# Patient Record
Sex: Female | Born: 1995 | Race: Asian | Hispanic: No | Marital: Single | Smoking: Never smoker
Health system: Southern US, Community
[De-identification: ages and names within clinical notes are randomized; demographics above are authoritative.]

## PROBLEM LIST (undated history)

## (undated) DIAGNOSIS — L930 Discoid lupus erythematosus: Secondary | ICD-10-CM

## (undated) HISTORY — PX: WRIST SURGERY: SHX841

---

## 2021-12-01 ENCOUNTER — Emergency Department (HOSPITAL_BASED_OUTPATIENT_CLINIC_OR_DEPARTMENT_OTHER)
Admission: EM | Admit: 2021-12-01 | Discharge: 2021-12-01 | Disposition: A | Discharge status: Home / Self Care | Payer: 59 | Attending: Emergency Medicine | Admitting: Emergency Medicine

## 2021-12-01 ENCOUNTER — Encounter (HOSPITAL_BASED_OUTPATIENT_CLINIC_OR_DEPARTMENT_OTHER): Payer: Self-pay | Admitting: Emergency Medicine

## 2021-12-01 ENCOUNTER — Emergency Department (HOSPITAL_BASED_OUTPATIENT_CLINIC_OR_DEPARTMENT_OTHER): Payer: 59

## 2021-12-01 ENCOUNTER — Other Ambulatory Visit: Payer: Self-pay

## 2021-12-01 DIAGNOSIS — W19XXXA Unspecified fall, initial encounter: Secondary | ICD-10-CM | POA: Diagnosis not present

## 2021-12-01 DIAGNOSIS — S59911A Unspecified injury of right forearm, initial encounter: Secondary | ICD-10-CM | POA: Diagnosis present

## 2021-12-01 DIAGNOSIS — S52501A Unspecified fracture of the lower end of right radius, initial encounter for closed fracture: Secondary | ICD-10-CM | POA: Insufficient documentation

## 2021-12-01 DIAGNOSIS — M25531 Pain in right wrist: Secondary | ICD-10-CM | POA: Insufficient documentation

## 2021-12-01 MED ORDER — KETOROLAC TROMETHAMINE 60 MG/2ML IM SOLN
30.0000 mg | Freq: Once | INTRAMUSCULAR | Status: AC
  Administered 2021-12-01: 30 mg via INTRAMUSCULAR
  Filled 2021-12-01: qty 2

## 2021-12-01 MED ORDER — IBUPROFEN 800 MG PO TABS: 800.0000 mg | ORAL_TABLET | Freq: Three times a day (TID) | ORAL | 0 refills | Status: DC

## 2021-12-01 MED ORDER — TRAMADOL HCL 50 MG PO TABS: 50.0000 mg | ORAL_TABLET | Freq: Four times a day (QID) | ORAL | 0 refills | Status: DC | PRN

## 2021-12-01 MED ORDER — TRAMADOL HCL 50 MG PO TABS
50.0000 mg | ORAL_TABLET | Freq: Once | ORAL | Status: AC
  Administered 2021-12-01: 50 mg via ORAL
  Filled 2021-12-01: qty 1

## 2021-12-01 NOTE — ED Provider Notes (Signed)
[facility] EMERGENCY DEPT Provider Note   CSN: [id number] Arrival date & time: 12/01/21  0023     History  Chief Complaint  Patient presents with   Cathy Olsen is a 26 y.o. female.  The history is provided by the patient.  Fall This is a new problem. The current episode started 6 to 12 hours ago. The problem occurs rarely. The problem has been resolved. Pertinent negatives include no chest pain, no abdominal pain, no headaches and no shortness of breath. Nothing aggravates the symptoms. Nothing relieves the symptoms. She has tried nothing for the symptoms. The treatment provided no relief.  Slipped getting out of the shower.  Fall on an outstretched hand, did not hit head.      Home Medications Prior to Admission medications   Not on File      Allergies    Patient has no known allergies.    Review of Systems   Review of Systems  Constitutional:  Negative for fever.  HENT:  Negative for facial swelling.   Eyes:  Negative for redness.  Respiratory:  Negative for shortness of breath.   Cardiovascular:  Negative for chest pain.  Gastrointestinal:  Negative for abdominal pain.  Musculoskeletal:  Positive for arthralgias and joint swelling. Negative for neck pain.  Skin:  Negative for rash.  Neurological:  Negative for headaches.  Psychiatric/Behavioral:  Negative for agitation.   All other systems reviewed and are negative.  Physical Exam Updated Vital Signs BP 96/68 (BP Location: Left Arm)    Pulse 75    Temp 97.7 F (36.5 C) (Oral)    Resp 18    Wt 56.7 kg    LMP 11/28/2021 (Approximate)    SpO2 98%  Physical Exam Vitals and nursing note reviewed.  Constitutional:      General: She is not in acute distress.    Appearance: Normal appearance.  HENT:     Head: Normocephalic and atraumatic.     Nose: Nose normal.  Eyes:     Extraocular Movements: Extraocular movements intact.     Conjunctiva/sclera: Conjunctivae normal.      Pupils: Pupils are equal, round, and reactive to light.  Cardiovascular:     Rate and Rhythm: Normal rate and regular rhythm.     Pulses: Normal pulses.     Heart sounds: Normal heart sounds.  Pulmonary:     Effort: Pulmonary effort is normal.     Breath sounds: Normal breath sounds.  Abdominal:     General: Abdomen is flat. Bowel sounds are normal.     Palpations: Abdomen is soft.     Tenderness: There is no abdominal tenderness. There is no guarding.     Hernia: No hernia is present.  Musculoskeletal:     Right wrist: Swelling, tenderness and bony tenderness present. No effusion, lacerations, snuff box tenderness or crepitus. Decreased range of motion. Normal pulse.     Right hand: Normal. No tenderness or bony tenderness. Normal range of motion. Normal strength. Normal sensation. There is no disruption of two-point discrimination. Normal capillary refill. Normal pulse.     Left hand: Normal.     Cervical back: Normal range of motion and neck supple.     Comments: Right hand NVI  Skin:    General: Skin is warm and dry.     Capillary Refill: Capillary refill takes less than 2 seconds.  Neurological:     General: No focal deficit present.  Mental Status: She is alert and oriented to person, place, and time.     Deep Tendon Reflexes: Reflexes normal.  Psychiatric:        Mood and Affect: Mood normal.        Behavior: Behavior normal.    ED Results / Procedures / Treatments   Labs (all labs ordered are listed, but only abnormal results are displayed) Labs Reviewed - No data to display  EKG None  Radiology DG Wrist Complete Right  Result Date: 12/01/2021 CLINICAL DATA:  Fall EXAM: RIGHT WRIST - COMPLETE 3+ VIEW COMPARISON:  None. FINDINGS: Comminuted transverse distal radial fracture. No definite intra-articular extension. Mild soft tissue swelling. IMPRESSION: Comminuted distal radial fracture. Electronically Signed   By: Julian Hy M.D.   On: 12/01/2021 01:26     Procedures Procedures    Medications Ordered in ED Medications  ketorolac (TORADOL) injection 30 mg (30 mg Intramuscular Given 12/01/21 0323)  traMADol (ULTRAM) tablet 50 mg (50 mg Oral Given 12/01/21 0324)    ED Course/ Medical Decision Making/ A&P                           Medical Decision Making Fall on outstretched hand, no associated symptoms  Amount and/or Complexity of Data Reviewed Radiology: ordered.  Risk Prescription drug management. Risk Details: FOOS mechanism.  Distal radius fracture.  Thumb spica splint applied.  NVI right hand.  Cap refill < 2 seconds to all digits of the hand pre and post splint.  Ice elevation pain medication and call hand surgery for close follow up.  Patient and father verbalize understanding.  Patient may not drive or drink alcohol while taking pain medication and keep splint dry at all times.      Final Clinical Impression(s) / ED Diagnoses Return for intractable cough, coughing up blood, fevers > 100.4 unrelieved by medication, shortness of breath, intractable vomiting, chest pain, shortness of breath, weakness, numbness, changes in speech, facial asymmetry, abdominal pain, passing out, Inability to tolerate liquids or food, cough, altered mental status or any concerns. No signs of systemic illness or infection. The patient is nontoxic-appearing on exam and vital signs are within normal limits.  I have reviewed the triage vital signs and the nursing notes. Pertinent labs & imaging results that were available during my care of the patient were reviewed by me and considered in my medical decision making (see chart for details). After history, exam, and medical workup I feel the patient has been appropriately medically screened and is safe for discharge home. Pertinent diagnoses were discussed with the patient. Patient was given return precautions.             Rubyann Lingle, MD 12/01/21 [phone]

## 2022-07-26 ENCOUNTER — Ambulatory Visit
Admission: RE | Admit: 2022-07-26 | Discharge: 2022-07-26 | Disposition: A | Discharge status: Home / Self Care | Payer: 59 | Source: Ambulatory Visit | Attending: Emergency Medicine | Admitting: Emergency Medicine

## 2022-07-26 VITALS — BP 103/73 | HR 106 | Temp 98.2°F | Resp 16

## 2022-07-26 DIAGNOSIS — J020 Streptococcal pharyngitis: Secondary | ICD-10-CM | POA: Diagnosis not present

## 2022-07-26 LAB — POCT RAPID STREP A (OFFICE): Rapid Strep A Screen: POSITIVE — AB

## 2022-07-26 MED ORDER — CEFDINIR 300 MG PO CAPS: 300.0000 mg | ORAL_CAPSULE | Freq: Two times a day (BID) | ORAL | 0 refills | Status: AC

## 2022-07-26 NOTE — Discharge Instructions (Signed)
Your strep test today is positive.  I recommend that you begin antibiotics now for treatment.  I have sent a prescription for cefdinir to your pharmacy.  Please take them as prescribed.    After 24 hours of antibiotics, please discard your toothbrush as well as any other oral devices that you are currently using and replace them with new ones to avoid reinfection.  You should begin to feel better in 24 to 48 hours.   Once you have been on antibiotics for a full 24 hours, you are no longer considered contagious.   I have provided you with a note to return to work.   Even if you are feeling better, please make sure that you finish the full 10-day course and do not skip any doses.  Failure to complete a full course of antibiotics for strep throat can result in worsening infection that may require longer treatment with stronger antibiotics.   Thank you for visiting urgent care today.

## 2022-07-26 NOTE — ED Triage Notes (Signed)
The patient states yesterday she began having a sore throat, nasal drainage and low grade fever. Home interventions: mucinex and advil

## 2022-07-26 NOTE — ED Provider Notes (Signed)
[facility]    CSN: [id number] Arrival date & time: 07/26/22  1534    HISTORY   Chief Complaint  Patient presents with   Sore Throat    Experiencing Sore Throat, Nasal Discharge (Dark Brown/Yellow), Low Grade Fever, Pain under R Breast - Entered by patient   Nasal Congestion   HPI Cathy Olsen is a pleasant, 26 y.o. female who presents to urgent care today. Patient complains of sore throat, dark brown/yellow nasal discharge, low-grade fever pain under her right breast that began yesterday.  Patient states she has been taking Mucinex and Advil without meaningful relief of her symptoms.  Patient denies headache, nausea, body aches, chills, known sick contacts.  The history is provided by the patient.   History reviewed. No pertinent past medical history. There are no problems to display for this patient.  History reviewed. No pertinent surgical history. OB History   No obstetric history on file.    Home Medications    Prior to Admission medications   Medication Sig Start Date End Date Taking? Authorizing Provider  ibuprofen (ADVIL) 800 MG tablet Take 1 tablet (800 mg total) by mouth 3 (three) times daily. 12/01/21   Palumbo, April, MD  traMADol (ULTRAM) 50 MG tablet Take 1 tablet (50 mg total) by mouth every 6 (six) hours as needed for severe pain. 12/01/21   Palumbo, April, MD    Family History History reviewed. No pertinent family history. Social History Social History   Tobacco Use   Smoking status: Never   Smokeless tobacco: Never  Vaping Use   Vaping Use: Never used  Substance Use Topics   Alcohol use: Never   Drug use: Never   Allergies   Patient has no known allergies.  Review of Systems Review of Systems Pertinent findings revealed after performing a 14 point review of systems has been noted in the history of present illness.  Physical Exam Triage Vital Signs ED Triage Vitals  Enc Vitals Group     BP 08/31/21 0827 (!) 147/82      Pulse Rate 08/31/21 0827 72     Resp 08/31/21 0827 18     Temp 08/31/21 0827 98.3 F (36.8 C)     Temp Source 08/31/21 0827 Oral     SpO2 08/31/21 0827 98 %     Weight --      Height --      Head Circumference --      Peak Flow --      Pain Score 08/31/21 0826 5     Pain Loc --      Pain Edu? --      Excl. in [place]? --   No data found.  Updated Vital Signs BP 103/73 (BP Location: Right Arm)   Pulse (!) 106   Temp 98.2 F (36.8 C) (Oral)   Resp 16   LMP 07/19/2022   SpO2 98%   Physical Exam Constitutional:      General: She is not in acute distress.    Appearance: She is well-developed. She is ill-appearing. She is not toxic-appearing.  HENT:     Head: Normocephalic and atraumatic.     Salivary Glands: Right salivary gland is diffusely enlarged and tender. Left salivary gland is diffusely enlarged and tender.     Right Ear: Hearing and external ear normal.     Left Ear: Hearing and external ear normal.     Ears:     Comments: Bilateral EACs with mild erythema, bilateral  TMs are normal    Nose: No mucosal edema, congestion or rhinorrhea.     Right Turbinates: Not enlarged, swollen or pale.     Left Turbinates: Not enlarged or swollen.     Right Sinus: No maxillary sinus tenderness or frontal sinus tenderness.     Left Sinus: No maxillary sinus tenderness or frontal sinus tenderness.     Mouth/Throat:     Lips: Pink. No lesions.     Mouth: Mucous membranes are moist. No oral lesions or angioedema.     Dentition: No gingival swelling.     Tongue: No lesions.     Palate: No mass.     Pharynx: Uvula midline. Pharyngeal swelling, oropharyngeal exudate and posterior oropharyngeal erythema present. No uvula swelling.     Tonsils: Tonsillar exudate present. 2+ on the right. 2+ on the left.  Eyes:     Extraocular Movements: Extraocular movements intact.     Conjunctiva/sclera: Conjunctivae normal.     Pupils: Pupils are equal, round, and reactive to light.  Neck:      Thyroid: No thyroid mass, thyromegaly or thyroid tenderness.     Trachea: Tracheal tenderness present. No abnormal tracheal secretions or tracheal deviation.     Comments: Voice is muffled Cardiovascular:     Rate and Rhythm: Normal rate and regular rhythm.     Pulses: Normal pulses.     Heart sounds: Normal heart sounds, S1 normal and S2 normal. No murmur heard.    No friction rub. No gallop.  Pulmonary:     Effort: Pulmonary effort is normal. No accessory muscle usage, prolonged expiration, respiratory distress or retractions.     Breath sounds: No stridor, decreased air movement or transmitted upper airway sounds. No decreased breath sounds, wheezing, rhonchi or rales.  Abdominal:     General: Bowel sounds are normal.     Palpations: Abdomen is soft.     Tenderness: There is generalized abdominal tenderness. There is no right CVA tenderness, left CVA tenderness or rebound. Negative signs include Murphy's sign.     Hernia: No hernia is present.  Musculoskeletal:        General: No tenderness. Normal range of motion.     Cervical back: Full passive range of motion without pain, normal range of motion and neck supple.     Right lower leg: No edema.     Left lower leg: No edema.  Lymphadenopathy:     Cervical: Cervical adenopathy present.     Right cervical: Superficial cervical adenopathy present.     Left cervical: Superficial cervical adenopathy present.  Skin:    General: Skin is warm and dry.     Findings: No erythema, lesion or rash.  Neurological:     General: No focal deficit present.     Mental Status: She is alert and oriented to person, place, and time. Mental status is at baseline.  Psychiatric:        Mood and Affect: Mood normal.        Behavior: Behavior normal.        Thought Content: Thought content normal.        Judgment: Judgment normal.     Visual Acuity Right Eye Distance:   Left Eye Distance:   Bilateral Distance:    Right Eye Near:   Left Eye Near:     Bilateral Near:     UC Couse / Diagnostics / Procedures:     Radiology No results found.  Procedures Procedures (including critical  care time) EKG  Pending results:  Labs Reviewed  POCT RAPID STREP A (OFFICE) - Abnormal; Notable for the following components:      Result Value   Rapid Strep A Screen Positive (*)    All other components within normal limits    Medications Ordered in UC: Medications - No data to display  UC Diagnoses / Final Clinical Impressions(s)   I have reviewed the triage vital signs and the nursing notes.  Pertinent labs & imaging results that were available during my care of the patient were reviewed by me and considered in my medical decision making (see chart for details).    Final diagnoses:  Streptococcal pharyngitis   Rapid strep test today is positive.  Patient provided with cefdinir for 10 days.  Return precautions advised.  ED Prescriptions     Medication Sig Dispense Auth. Provider   cefdinir (OMNICEF) 300 MG capsule Take 1 capsule (300 mg total) by mouth 2 (two) times daily for 10 days. 20 capsule Theadora Rama Scales, PA-C      PDMP not reviewed this encounter.  Disposition Upon Discharge:  Condition: stable for discharge home Home: take medications as prescribed; routine discharge instructions as discussed; follow up as advised.  Patient presented with an acute illness with associated systemic symptoms and significant discomfort requiring urgent management. In my opinion, this is a condition that a prudent lay person (someone who possesses an average knowledge of health and medicine) may potentially expect to result in complications if not addressed urgently such as respiratory distress, impairment of bodily function or dysfunction of bodily organs.   Routine symptom specific, illness specific and/or disease specific instructions were discussed with the patient and/or caregiver at length.   As such, the patient has been  evaluated and assessed, work-up was performed and treatment was provided in alignment with urgent care protocols and evidence based medicine.  Patient/parent/caregiver has been advised that the patient may require follow up for further testing and treatment if the symptoms continue in spite of treatment, as clinically indicated and appropriate.  If the patient was tested for COVID-19, Influenza and/or RSV, then the patient/parent/guardian was advised to isolate at home pending the results of his/her diagnostic coronavirus test and potentially longer if they're positive. I have also advised pt that if his/her COVID-19 test returns positive, it's recommended to self-isolate for at least 10 days after symptoms first appeared AND until fever-free for 24 hours without fever reducer AND other symptoms have improved or resolved. Discussed self-isolation recommendations as well as instructions for household member/close contacts as per the [facility] and [place] [facility], and also gave patient the COVID packet with this information.  Patient/parent/caregiver has been advised to return to the [facility] or PCP in 3-5 days if no better; to PCP or the Emergency Department if new signs and symptoms develop, or if the current signs or symptoms continue to change or worsen for further workup, evaluation and treatment as clinically indicated and appropriate  The patient will follow up with their current PCP if and as advised. If the patient does not currently have a PCP we will assist them in obtaining one.   The patient may need specialty follow up if the symptoms continue, in spite of conservative treatment and management, for further workup, evaluation, consultation and treatment as clinically indicated and appropriate.  Patient/parent/caregiver verbalized understanding and agreement of plan as discussed.  All questions were addressed during visit.  Please see discharge instructions below for further details of plan.  Discharge  Instructions:   Discharge Instructions      Your strep test today is positive.  I recommend that you begin antibiotics now for treatment.  I have sent a prescription for cefdinir to your pharmacy.  Please take them as prescribed.    After 24 hours of antibiotics, please discard your toothbrush as well as any other oral devices that you are currently using and replace them with new ones to avoid reinfection.  You should begin to feel better in 24 to 48 hours.   Once you have been on antibiotics for a full 24 hours, you are no longer considered contagious.   I have provided you with a note to return to work.   Even if you are feeling better, please make sure that you finish the full 10-day course and do not skip any doses.  Failure to complete a full course of antibiotics for strep throat can result in worsening infection that may require longer treatment with stronger antibiotics.   Thank you for visiting urgent care today.      This office note has been dictated using [employer] laboratory technicianDragon speech recognition software.  Unfortunately, this method of dictation can sometimes lead to typographical or grammatical errors.  I apologize for your inconvenience in advance if this occurs.  Please do not hesitate to reach out to me if clarification is needed.      Theadora RamaMorgan, Merrily Tegeler Scales, PA-C 07/26/22 1918

## 2022-09-07 ENCOUNTER — Ambulatory Visit
Admission: RE | Admit: 2022-09-07 | Discharge: 2022-09-07 | Disposition: A | Discharge status: Home / Self Care | Payer: 59 | Source: Ambulatory Visit | Attending: Physician Assistant | Admitting: Physician Assistant

## 2022-09-07 VITALS — BP 105/73 | HR 89 | Temp 98.2°F | Resp 16

## 2022-09-07 DIAGNOSIS — J209 Acute bronchitis, unspecified: Secondary | ICD-10-CM | POA: Diagnosis not present

## 2022-09-07 DIAGNOSIS — R112 Nausea with vomiting, unspecified: Secondary | ICD-10-CM | POA: Diagnosis not present

## 2022-09-07 MED ORDER — ONDANSETRON 4 MG PO TBDP: 4.0000 mg | ORAL_TABLET | Freq: Three times a day (TID) | ORAL | 0 refills | Status: DC | PRN

## 2022-09-07 MED ORDER — PREDNISONE 20 MG PO TABS: 40.0000 mg | ORAL_TABLET | Freq: Every day | ORAL | 0 refills | Status: AC

## 2022-09-07 NOTE — ED Triage Notes (Signed)
Pt c/o sore throat, SOB, congestion and vomiting.   Started: Monday  Home interventions: advil, tylenol, mucinex

## 2022-09-07 NOTE — ED Provider Notes (Signed)
[facility]    CSN: [id number] Arrival date & time: 09/07/22  1329      History   Chief Complaint Chief Complaint  Patient presents with   Sore Throat    Symptoms of sore throat, chest congestion, shortness of breath, and nausea/vommiting - Entered by patient    HPI Cathy Olsen is a 26 y.o. female.   Patient here today for evaluation of sore throat and congestion that started about a week ago.  She reports that she has since developed some vomiting that started yesterday as well as mild shortness of breath.  She has not had any fever.  She denies any diarrhea but does report some loose stools.  She has tried Advil, Tylenol and Mucinex without resolution.  She denies risk of pregnancy  The history is provided by the patient.  Sore Throat Associated symptoms include shortness of breath. Pertinent negatives include no abdominal pain.    History reviewed. No pertinent past medical history.  There are no problems to display for this patient.   History reviewed. No pertinent surgical history.  OB History   No obstetric history on file.      Home Medications    Prior to Admission medications   Medication Sig Start Date End Date Taking? Authorizing Provider  ondansetron (ZOFRAN-ODT) 4 MG disintegrating tablet Take 1 tablet (4 mg total) by mouth every 8 (eight) hours as needed for nausea or vomiting. 09/07/22  Yes Francene Finders, PA-C  predniSONE (DELTASONE) 20 MG tablet Take 2 tablets (40 mg total) by mouth daily with breakfast for 5 days. 09/07/22 09/12/22 Yes Francene Finders, PA-C  ibuprofen (ADVIL) 800 MG tablet Take 1 tablet (800 mg total) by mouth 3 (three) times daily. 12/01/21   Palumbo, April, MD    Family History History reviewed. No pertinent family history.  Social History Social History   Tobacco Use   Smoking status: Never   Smokeless tobacco: Never  Vaping Use   Vaping Use: Never used  Substance Use Topics   Alcohol use: Never    Drug use: Never     Allergies   Patient has no known allergies.   Review of Systems Review of Systems  Constitutional:  Negative for chills and fever.  HENT:  Positive for congestion and sore throat. Negative for ear pain.   Eyes:  Negative for discharge and redness.  Respiratory:  Positive for cough and shortness of breath. Negative for wheezing.   Gastrointestinal:  Positive for nausea and vomiting. Negative for abdominal pain and diarrhea.     Physical Exam Triage Vital Signs ED Triage Vitals  Enc Vitals Group     BP      Pulse      Resp      Temp      Temp src      SpO2      Weight      Height      Head Circumference      Peak Flow      Pain Score      Pain Loc      Pain Edu?      Excl. in [place]?    No data found.  Updated Vital Signs BP 105/73 (BP Location: Left Arm)   Pulse 89   Temp 98.2 F (36.8 C) (Oral)   Resp 16   LMP 09/03/2022   SpO2 98%   Physical Exam Vitals and nursing note reviewed.  Constitutional:  General: She is not in acute distress.    Appearance: Normal appearance. She is not ill-appearing.  HENT:     Head: Normocephalic and atraumatic.     Nose: Congestion present.     Mouth/Throat:     Mouth: Mucous membranes are moist.     Pharynx: No oropharyngeal exudate or posterior oropharyngeal erythema.  Eyes:     Conjunctiva/sclera: Conjunctivae normal.  Cardiovascular:     Rate and Rhythm: Normal rate and regular rhythm.     Heart sounds: Normal heart sounds. No murmur heard. Pulmonary:     Effort: Pulmonary effort is normal. No respiratory distress.     Breath sounds: Normal breath sounds. No wheezing, rhonchi or rales.  Skin:    General: Skin is warm and dry.  Neurological:     Mental Status: She is alert.  Psychiatric:        Mood and Affect: Mood normal.        Thought Content: Thought content normal.      UC Treatments / Results  Labs (all labs ordered are listed, but only abnormal results are displayed) Labs  Reviewed - No data to display  EKG   Radiology No results found.  Procedures Procedures (including critical care time)  Medications Ordered in UC Medications - No data to display  Initial Impression / Assessment and Plan / UC Course  I have reviewed the triage vital signs and the nursing notes.  Pertinent labs & imaging results that were available during my care of the patient were reviewed by me and considered in my medical decision making (see chart for details).    Low suspicion of bacterial illness given lack of fever.  Will treat with steroids to hopefully improve symptoms questionably due to bronchitis and or other pharyngitis of viral/allergic etiology.  Encouraged follow-up if no gradual improvement or with any further concerns.  Zofran prescribed for nausea.  Final Clinical Impressions(s) / UC Diagnoses   Final diagnoses:  Acute bronchitis, unspecified organism  Nausea and vomiting, unspecified vomiting type   Discharge Instructions   None    ED Prescriptions     Medication Sig Dispense Auth. Provider   ondansetron (ZOFRAN-ODT) 4 MG disintegrating tablet Take 1 tablet (4 mg total) by mouth every 8 (eight) hours as needed for nausea or vomiting. 20 tablet Ewell Poe F, PA-C   predniSONE (DELTASONE) 20 MG tablet Take 2 tablets (40 mg total) by mouth daily with breakfast for 5 days. 10 tablet Francene Finders, PA-C      PDMP not reviewed this encounter.   Francene Finders, PA-C 09/07/22 1422

## 2023-01-30 IMAGING — DX DG WRIST COMPLETE 3+V*R*
1 series · 3 of 3 positions shown · non-contrast
Comparison: None.

CLINICAL DATA: Fall

EXAM:
RIGHT WRIST - COMPLETE 3+ VIEW

[Series 1: wrist · 0.14mm/px · 3 of 3 slices shown]
[im 1/3]
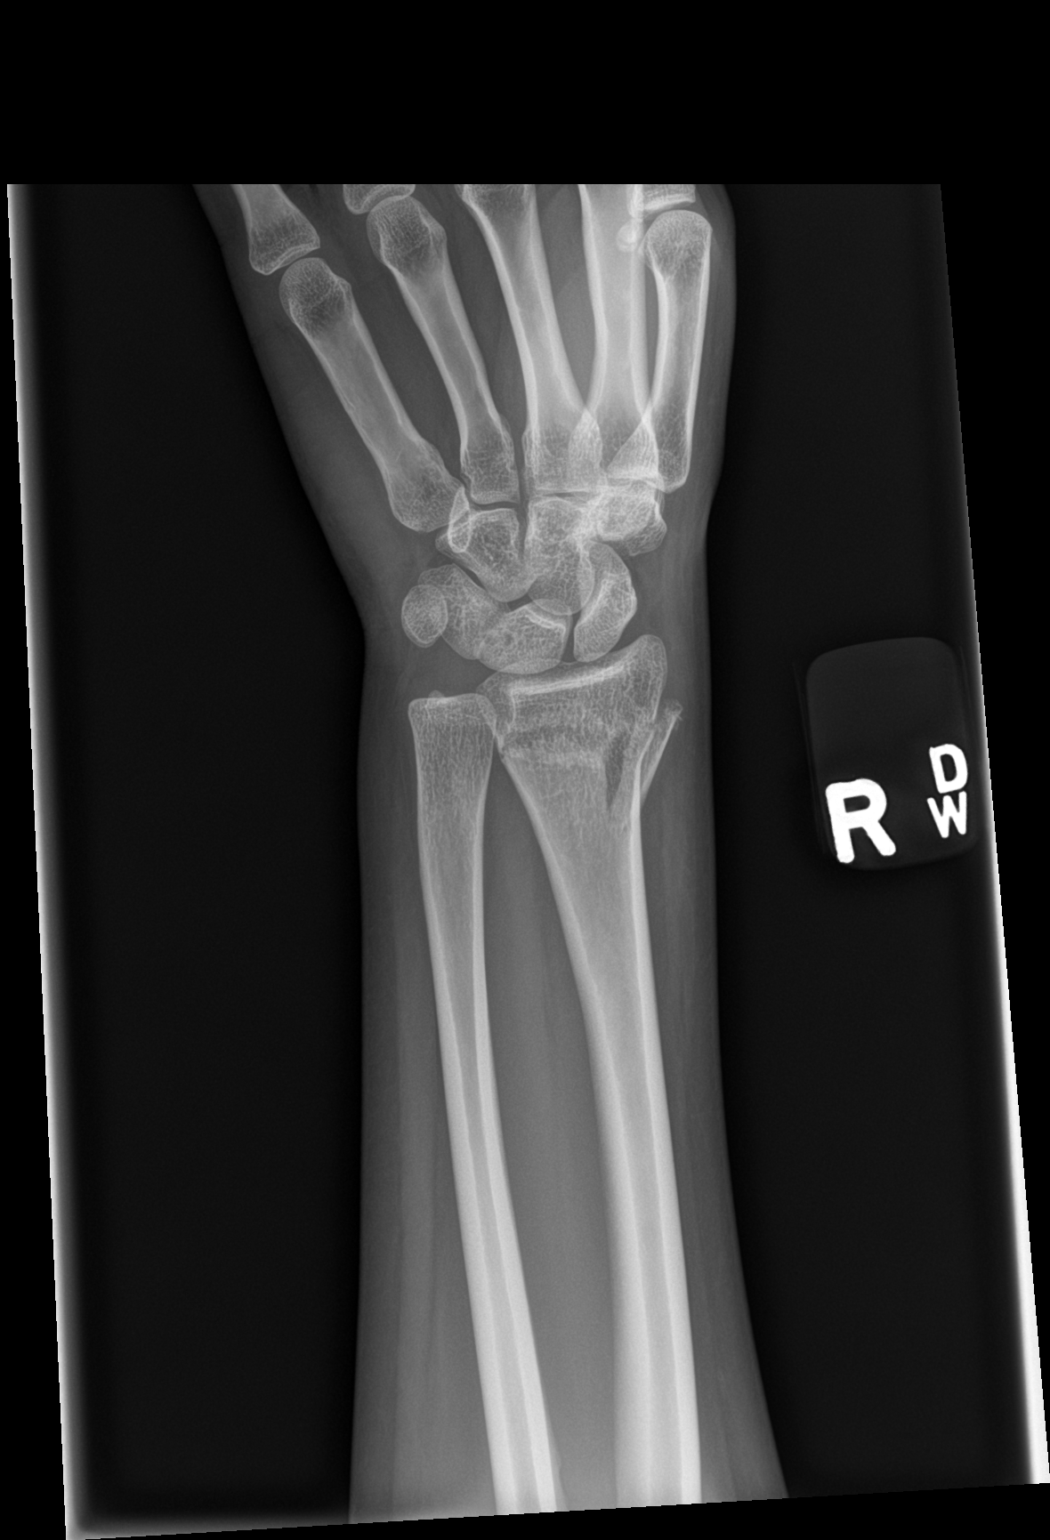
[im 2/3]
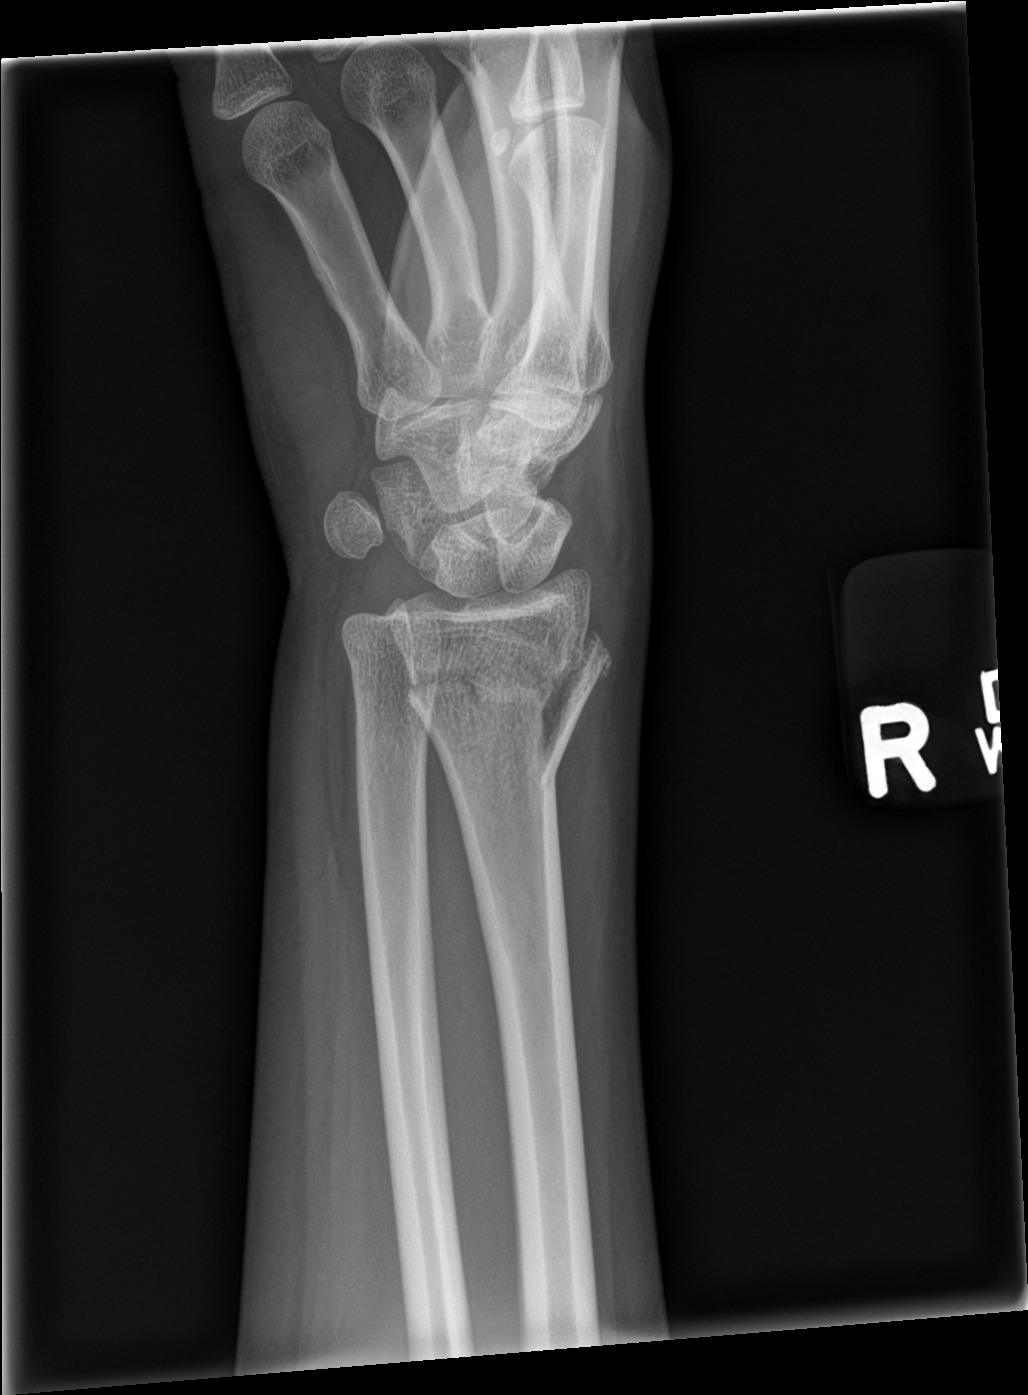
[im 3/3]
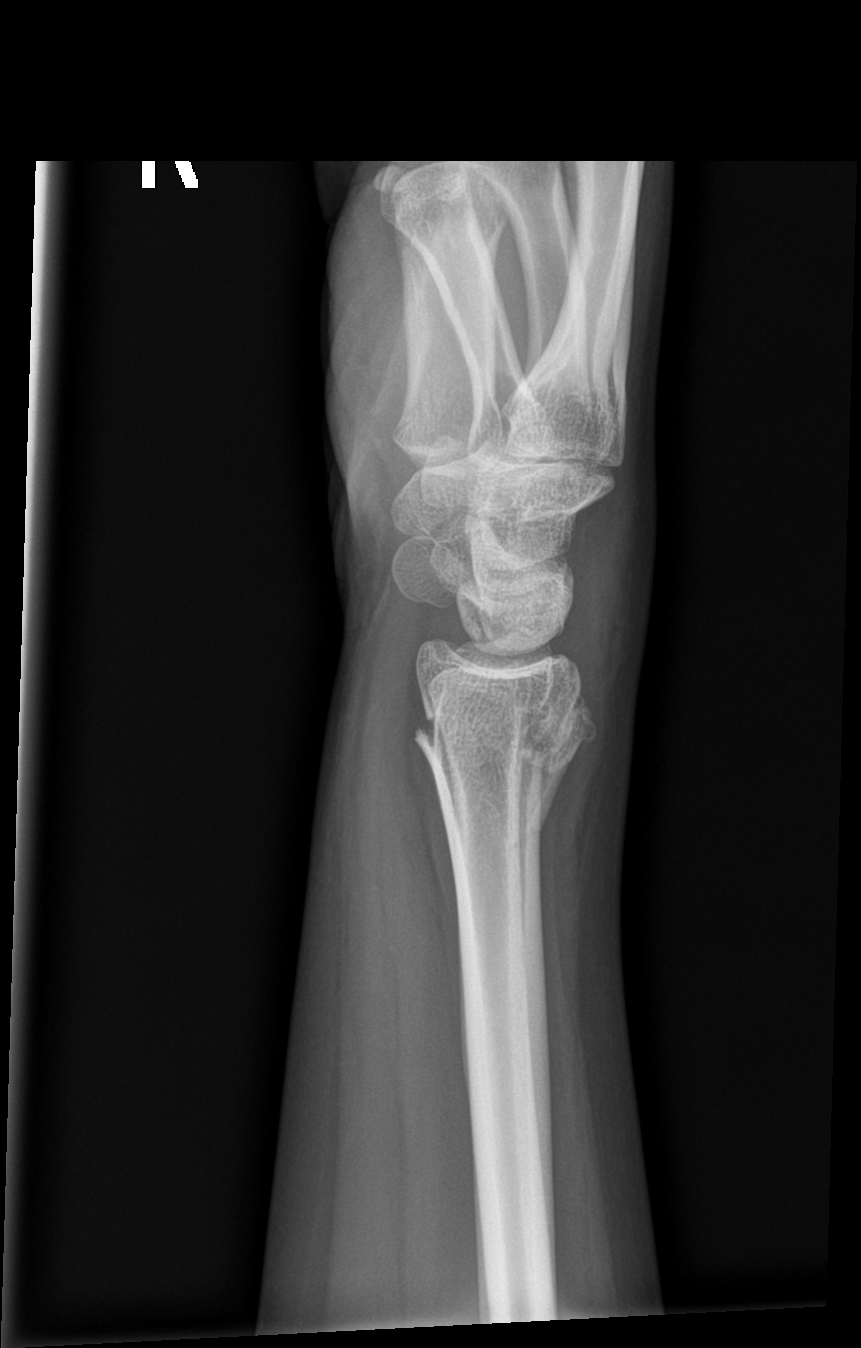

[3 of 3 positions shown; findings below may reference images not displayed]

FINDINGS: Comminuted transverse distal radial fracture. No definite
intra-articular extension.

Mild soft tissue swelling.
IMPRESSION: Comminuted distal radial fracture.

## 2023-11-05 ENCOUNTER — Ambulatory Visit
Admission: RE | Admit: 2023-11-05 | Discharge: 2023-11-05 | Disposition: A | Discharge status: Home / Self Care | Payer: 59 | Source: Ambulatory Visit | Attending: Family Medicine | Admitting: Family Medicine

## 2023-11-05 VITALS — BP 126/73 | HR 98 | Temp 98.7°F | Resp 16

## 2023-11-05 DIAGNOSIS — B349 Viral infection, unspecified: Secondary | ICD-10-CM | POA: Diagnosis not present

## 2023-11-05 DIAGNOSIS — J029 Acute pharyngitis, unspecified: Secondary | ICD-10-CM | POA: Diagnosis not present

## 2023-11-05 LAB — POCT RAPID STREP A (OFFICE): Rapid Strep A Screen: NEGATIVE

## 2023-11-05 LAB — POCT INFLUENZA A/B
Influenza A, POC: NEGATIVE
Influenza B, POC: NEGATIVE

## 2023-11-05 MED ORDER — LIDOCAINE VISCOUS HCL 2 % MT SOLN: 15.0000 mL | Freq: Four times a day (QID) | OROMUCOSAL | 0 refills | Status: DC | PRN

## 2023-11-05 NOTE — Discharge Instructions (Addendum)
 The clinical contact you with results of the strep throat culture done today if positive.  Lots of rest and fluids.  You can do salt water gargles as well as warm liquids such as teas and honey.  Over-the-counter Tylenol or ibuprofen  as needed.  Please follow-up with your PCP if your symptoms do not improve.  Please go to the ER for any worsening symptoms.  Hope you feel better soon!

## 2023-11-05 NOTE — ED Triage Notes (Signed)
 Patient states cough and sore throat x 2 days. Patient is coughing up green mucous.Cathy Olsen

## 2023-11-05 NOTE — ED Provider Notes (Signed)
 [facility]    CSN: [id number] Arrival date & time: 11/05/23  1843      History   Chief Complaint Chief Complaint  Patient presents with   Sore Throat    Extremely sore throat starting 24-36 hours ago - Entered by patient    HPI Cathy Olsen is a 27 y.o. female  presents for evaluation of URI symptoms for 2 days. Patient reports associated symptoms of sore throat, cough, congestion. Denies N/V/D, ear pain, fevers, body aches, shortness of breath. Patient does note that his have a hx of asthma. Patient is not an active smoker.   Reports no sick contacts.  Pt has taken allergy medicine OTC for symptoms. Pt has no other concerns at this time.    Sore Throat    History reviewed. No pertinent past medical history.  There are no active problems to display for this patient.   History reviewed. No pertinent surgical history.  OB History   No obstetric history on file.      Home Medications    Prior to Admission medications   Medication Sig Start Date End Date Taking? Authorizing Provider  lidocaine  (XYLOCAINE ) 2 % solution Use as directed 15 mLs in the mouth or throat every 6 (six) hours as needed (sore throat). Gargle and spit do not swallow 11/05/23  Yes Tresean Mattix, Jodi R, NP  ibuprofen  (ADVIL ) 800 MG tablet Take 1 tablet (800 mg total) by mouth 3 (three) times daily. 12/01/21   Palumbo, April, MD  ondansetron  (ZOFRAN -ODT) 4 MG disintegrating tablet Take 1 tablet (4 mg total) by mouth every 8 (eight) hours as needed for nausea or vomiting. 09/07/22   Billy Asberry FALCON, PA-C    Family History History reviewed. No pertinent family history.  Social History Social History   Tobacco Use   Smoking status: Never   Smokeless tobacco: Never  Vaping Use   Vaping status: Never Used  Substance Use Topics   Alcohol use: Never   Drug use: Never     Allergies   Justicia adhatoda   Review of Systems Review of Systems  HENT:  Positive for congestion and  sore throat.   Respiratory:  Positive for cough.      Physical Exam Triage Vital Signs ED Triage Vitals [11/05/23 1855]  Encounter Vitals Group     BP 126/73     Systolic BP Percentile      Diastolic BP Percentile      Pulse Rate 98     Resp 16     Temp 98.7 F (37.1 C)     Temp Source Oral     SpO2 98 %     Weight      Height      Head Circumference      Peak Flow      Pain Score      Pain Loc      Pain Education      Exclude from Growth Chart    No data found.  Updated Vital Signs BP 126/73 (BP Location: Left Arm)   Pulse 98   Temp 98.7 F (37.1 C) (Oral)   Resp 16   SpO2 98%   Visual Acuity Right Eye Distance:   Left Eye Distance:   Bilateral Distance:    Right Eye Near:   Left Eye Near:    Bilateral Near:     Physical Exam Vitals and nursing note reviewed.  Constitutional:      General: She is not  in acute distress.    Appearance: She is well-developed. She is not ill-appearing.  HENT:     Head: Normocephalic and atraumatic.     Right Ear: Tympanic membrane and ear canal normal.     Left Ear: Tympanic membrane and ear canal normal.     Nose: Congestion present.     Mouth/Throat:     Mouth: Mucous membranes are moist.     Pharynx: Oropharynx is clear. Uvula midline. Posterior oropharyngeal erythema present.     Tonsils: No tonsillar exudate or tonsillar abscesses.  Eyes:     Conjunctiva/sclera: Conjunctivae normal.     Pupils: Pupils are equal, round, and reactive to light.  Cardiovascular:     Rate and Rhythm: Normal rate and regular rhythm.     Heart sounds: Normal heart sounds.  Pulmonary:     Effort: Pulmonary effort is normal.     Breath sounds: Normal breath sounds.  Musculoskeletal:     Cervical back: Normal range of motion and neck supple.  Lymphadenopathy:     Cervical: No cervical adenopathy.  Skin:    General: Skin is warm and dry.  Neurological:     General: No focal deficit present.     Mental Status: She is alert and  oriented to person, place, and time.  Psychiatric:        Mood and Affect: Mood normal.        Behavior: Behavior normal.      UC Treatments / Results  Labs (all labs ordered are listed, but only abnormal results are displayed) Labs Reviewed  CULTURE, GROUP A STREP [facility])  POCT RAPID STREP A (OFFICE)  POCT INFLUENZA A/B    EKG   Radiology No results found.  Procedures Procedures (including critical care time)  Medications Ordered in UC Medications - No data to display  Initial Impression / Assessment and Plan / UC Course  I have reviewed the triage vital signs and the nursing notes.  Pertinent labs & imaging results that were available during my care of the patient were reviewed by me and considered in my medical decision making (see chart for details).     Reviewed exam and symptoms with patient.  No red flags.  Negative rapid strep and rapid flu will send throat culture.  Patient declined COVID testing.  Discussed viral illness and symptomatic treatment.  Lidocaine  gargle as needed.  Encouraged warm liquids and salt water gargles.  PCP follow-up as symptoms do not improve.  ER precautions reviewed. Final Clinical Impressions(s) / UC Diagnoses   Final diagnoses:  Sore throat  Viral illness     Discharge Instructions      The clinical contact you with results of the strep throat culture done today if positive.  Lots of rest and fluids.  You can do salt water gargles as well as warm liquids such as teas and honey.  Over-the-counter Tylenol or ibuprofen  as needed.  Please follow-up with your PCP if your symptoms do not improve.  Please go to the ER for any worsening symptoms.  Hope you feel better soon!     ED Prescriptions     Medication Sig Dispense Auth. Provider   lidocaine  (XYLOCAINE ) 2 % solution Use as directed 15 mLs in the mouth or throat every 6 (six) hours as needed (sore throat). Gargle and spit do not swallow 100 mL Ryver Poblete, Jodi R, NP      PDMP  not reviewed this encounter.   Loreda Myla SAUNDERS, NP 11/05/23 [phone]

## 2023-11-08 LAB — CULTURE, GROUP A STREP (THRC)

## 2024-05-23 ENCOUNTER — Ambulatory Visit
Admission: RE | Admit: 2024-05-23 | Discharge: 2024-05-23 | Disposition: A | Discharge status: Home / Self Care | Source: Ambulatory Visit | Attending: Family Medicine | Admitting: Family Medicine

## 2024-05-23 VITALS — BP 104/69 | HR 87 | Temp 98.8°F | Resp 14

## 2024-05-23 DIAGNOSIS — H60502 Unspecified acute noninfective otitis externa, left ear: Secondary | ICD-10-CM

## 2024-05-23 MED ORDER — CIPROFLOXACIN-DEXAMETHASONE 0.3-0.1 % OT SUSP: 4.0000 [drp] | Freq: Two times a day (BID) | OTIC | 0 refills | Status: DC

## 2024-05-23 NOTE — ED Provider Notes (Signed)
 [facility]  Note:  This document was prepared using Conservation officer, historic buildings and may include unintentional dictation errors.  MRN: [id number] DOB: 08/06/1996  Subjective:   Cathy Olsen is a 28 y.o. female presenting for 4 day history of acute onset persistent and worsening left ear pain that radiates internally. No fever, drainage, runny or stuffy nose, dizziness, tinnitus, rashes, recent swimming, use of Qtips/ear buds/ear plugs.   No current facility-administered medications for this encounter.  Current Outpatient Medications:    acetaminophen (TYLENOL) 500 MG tablet, Take 500 mg by mouth every 6 (six) hours as needed., Disp: , Rfl:    albuterol (VENTOLIN HFA) 108 (90 Base) MCG/ACT inhaler, Inhale 2 puffs into the lungs., Disp: , Rfl:    ibuprofen  (ADVIL ) 800 MG tablet, Take 1 tablet (800 mg total) by mouth 3 (three) times daily., Disp: 21 tablet, Rfl: 0   lidocaine  (XYLOCAINE ) 2 % solution, Use as directed 15 mLs in the mouth or throat every 6 (six) hours as needed (sore throat). Gargle and spit do not swallow, Disp: 100 mL, Rfl: 0   ondansetron  (ZOFRAN -ODT) 4 MG disintegrating tablet, Take 1 tablet (4 mg total) by mouth every 8 (eight) hours as needed for nausea or vomiting., Disp: 20 tablet, Rfl: 0   Allergies  Allergen Reactions   Justicia Adhatoda Other (See Comments)   Pollen Extract Other (See Comments)    History reviewed. No pertinent past medical history.   Past Surgical History:  Procedure Laterality Date   WRIST SURGERY      History reviewed. No pertinent family history.  Social History   Tobacco Use   Smoking status: Never   Smokeless tobacco: Never  Vaping Use   Vaping status: Never Used  Substance Use Topics   Alcohol use: Never   Drug use: Never    ROS   Objective:   Vitals: BP 104/69 (BP Location: Right Arm)   Pulse 87   Temp 98.8 F (37.1 C) (Oral)   Resp 14   LMP 04/24/2024 (Exact Date)    SpO2 98%   Physical Exam Constitutional:      General: She is not in acute distress.    Appearance: Normal appearance. She is well-developed. She is not ill-appearing, toxic-appearing or diaphoretic.  HENT:     Head: Normocephalic and atraumatic.     Right Ear: Tympanic membrane, ear canal and external ear normal. No tenderness. There is no impacted cerumen. Tympanic membrane is not injected, perforated, erythematous or bulging.     Left Ear: Tympanic membrane normal. No tenderness. There is no impacted cerumen. Tympanic membrane is not injected, perforated, erythematous or bulging.     Ears:     Comments: Erythematic tender ear canal worst distally with slight drainage and trace swelling.     Nose: Nose normal.     Mouth/Throat:     Mouth: Mucous membranes are moist.  Eyes:     General: No scleral icterus.       Right eye: No discharge.        Left eye: No discharge.     Extraocular Movements: Extraocular movements intact.  Cardiovascular:     Rate and Rhythm: Normal rate.  Pulmonary:     Effort: Pulmonary effort is normal.  Skin:    General: Skin is warm and dry.  Neurological:     General: No focal deficit present.     Mental Status: She is alert and oriented to person, place, and time.  Psychiatric:        Mood and Affect: Mood normal.        Behavior: Behavior normal.     Assessment and Plan :   PDMP not reviewed this encounter.  1. Acute otitis externa of left ear, unspecified type    Start Ciprodex  to cover for otitis externa. Use supportive care otherwise. Counseled patient on potential for adverse effects with medications prescribed/recommended today, ER and return-to-clinic precautions discussed, patient verbalized understanding.    Christopher Savannah, PA-C 05/23/24 1021

## 2024-05-23 NOTE — ED Triage Notes (Signed)
 Pt reports left ear pain and fullness x 4 days. Tylenol gives no relief.

## 2024-07-24 ENCOUNTER — Other Ambulatory Visit: Payer: Self-pay

## 2024-07-24 ENCOUNTER — Ambulatory Visit
Admission: RE | Admit: 2024-07-24 | Discharge: 2024-07-24 | Disposition: A | Discharge status: Home / Self Care | Payer: Self-pay | Source: Ambulatory Visit | Attending: Family Medicine | Admitting: Family Medicine

## 2024-07-24 VITALS — BP 111/83 | HR 92 | Temp 97.4°F | Resp 16

## 2024-07-24 DIAGNOSIS — J069 Acute upper respiratory infection, unspecified: Secondary | ICD-10-CM | POA: Diagnosis not present

## 2024-07-24 DIAGNOSIS — H60501 Unspecified acute noninfective otitis externa, right ear: Secondary | ICD-10-CM | POA: Diagnosis not present

## 2024-07-24 HISTORY — DX: Discoid lupus erythematosus: L93.0

## 2024-07-24 MED ORDER — CIPROFLOXACIN-DEXAMETHASONE 0.3-0.1 % OT SUSP: 4.0000 [drp] | Freq: Two times a day (BID) | OTIC | 0 refills | Status: AC

## 2024-07-24 NOTE — Discharge Instructions (Addendum)
 Start Ciprodex  antibiotic eardrops as prescribed.  Keep water out of the ear until treatment is complete.  You may use Tylenol or ibuprofen  as needed for pain.  Follow-up with your PCP if your symptoms do not improve.  Please go to the ER for any worsening symptoms.  Hope you feel better soon exclamation

## 2024-07-24 NOTE — ED Triage Notes (Addendum)
 Pt c/o pain in right earx3d. Pt c/o sore throat, sinus pressure, nasal congestion, low grad feverx3-4d. Pt stats the ear pain has been getting worse. Pt states the sore throat and fever have resolved.

## 2024-07-24 NOTE — ED Provider Notes (Signed)
 [facility]    CSN: [id number] Arrival date & time: 07/24/24  1204      History   Chief Complaint Chief Complaint  Patient presents with   Otalgia   Nasal Congestion    HPI Cathy Olsen is a 28 y.o. female  presents for evaluation of URI symptoms for 3-4 days. Patient reports associated symptoms of nasal congestion and right ear pain.  States had a sore throat and low-grade fever initially but those have since resolved.  Denies N/V/D, cough, body aches, shortness of breath. Patient does not have a hx of asthma. Patient is not an active smoker.   Reports no known sick contacts.  Denies uses of Q-tips, earbuds or excessive water in ear. Pt has no other concerns at this time.    Otalgia Associated symptoms: congestion     Past Medical History:  Diagnosis Date   Discoid lupus     There are no active problems to display for this patient.   Past Surgical History:  Procedure Laterality Date   WRIST SURGERY      OB History   No obstetric history on file.      Home Medications    Prior to Admission medications   Medication Sig Start Date End Date Taking? Authorizing Provider  ciprofloxacin -dexamethasone  (CIPRODEX ) OTIC suspension Place 4 drops into the right ear 2 (two) times daily for 7 days. 07/24/24 07/31/24 Yes Shalissa Easterwood, Jodi R, NP  hydroxychloroquine (PLAQUENIL) 200 MG tablet Take 200 mg by mouth 2 (two) times daily. 07/19/24  Yes [provider]  acetaminophen (TYLENOL) 500 MG tablet Take 500 mg by mouth every 6 (six) hours as needed.    [provider]    Family History History reviewed. No pertinent family history.  Social History Social History   Tobacco Use   Smoking status: Never   Smokeless tobacco: Never  Vaping Use   Vaping status: Never Used  Substance Use Topics   Alcohol use: Never   Drug use: Never     Allergies   Justicia adhatoda and Pollen extract   Review of Systems Review of Systems  HENT:   Positive for congestion and ear pain.      Physical Exam Triage Vital Signs ED Triage Vitals  Encounter Vitals Group     BP 07/24/24 1247 111/83     Girls Systolic BP Percentile --      Girls Diastolic BP Percentile --      Boys Systolic BP Percentile --      Boys Diastolic BP Percentile --      Pulse Rate 07/24/24 1247 92     Resp 07/24/24 1247 16     Temp 07/24/24 1247 (!) 97.4 F (36.3 C)     Temp Source 07/24/24 1247 Oral     SpO2 07/24/24 1247 95 %     Weight --      Height --      Head Circumference --      Peak Flow --      Pain Score 07/24/24 1244 8     Pain Loc --      Pain Education --      Exclude from Growth Chart --    No data found.  Updated Vital Signs BP 111/83   Pulse 92   Temp (!) 97.4 F (36.3 C) (Oral)   Resp 16   LMP 07/23/2024   SpO2 95%   Visual Acuity Right Eye Distance:   Left Eye Distance:  Bilateral Distance:    Right Eye Near:   Left Eye Near:    Bilateral Near:     Physical Exam Vitals and nursing note reviewed.  Constitutional:      General: She is not in acute distress.    Appearance: She is well-developed. She is not ill-appearing.  HENT:     Head: Normocephalic and atraumatic.     Right Ear: Tympanic membrane and ear canal normal. Tenderness present. No drainage or swelling. No mastoid tenderness. Tympanic membrane is not erythematous.     Left Ear: Tympanic membrane and ear canal normal.     Ears:     Comments: Right canal is erythematous without swelling or active drainage.  Area is tender with insertion of otoscope    Nose: Congestion present.     Mouth/Throat:     Mouth: Mucous membranes are moist.     Pharynx: Oropharynx is clear. Uvula midline. No oropharyngeal exudate or posterior oropharyngeal erythema.     Tonsils: No tonsillar exudate or tonsillar abscesses.  Eyes:     Conjunctiva/sclera: Conjunctivae normal.     Pupils: Pupils are equal, round, and reactive to light.  Cardiovascular:     Rate and Rhythm:  Normal rate and regular rhythm.     Heart sounds: Normal heart sounds.  Pulmonary:     Effort: Pulmonary effort is normal.     Breath sounds: Normal breath sounds.  Musculoskeletal:     Cervical back: Normal range of motion and neck supple.  Lymphadenopathy:     Cervical: No cervical adenopathy.  Skin:    General: Skin is warm and dry.  Neurological:     General: No focal deficit present.     Mental Status: She is alert and oriented to person, place, and time.  Psychiatric:        Mood and Affect: Mood normal.        Behavior: Behavior normal.      UC Treatments / Results  Labs (all labs ordered are listed, but only abnormal results are displayed) Labs Reviewed - No data to display  EKG   Radiology No results found.  Procedures Procedures (including critical care time)  Medications Ordered in UC Medications - No data to display  Initial Impression / Assessment and Plan / UC Course  I have reviewed the triage vital signs and the nursing notes.  Pertinent labs & imaging results that were available during my care of the patient were reviewed by me and considered in my medical decision making (see chart for details).     Reviewed exam and symptoms with patient.  No red flags.  Patient declined COVID testing.  Discussed viral upper respiratory illness with right otitis externa.  Start Ciprodex  antibiotic drops and patient instructed to keep water out of the ear until treatment is complete.  Avoid Q-tips and earbuds.  Encourage rest fluids and PCP follow-up if symptoms do not improve.  ER precautions reviewed Final Clinical Impressions(s) / UC Diagnoses   Final diagnoses:  Acute otitis externa of right ear, unspecified type  Viral upper respiratory illness     Discharge Instructions      Start Ciprodex  antibiotic eardrops as prescribed.  Keep water out of the ear until treatment is complete.  You may use Tylenol or ibuprofen  as needed for pain.  Follow-up with your  PCP if your symptoms do not improve.  Please go to the ER for any worsening symptoms.  Hope you feel better soon exclamation  ED Prescriptions     Medication Sig Dispense Auth. Provider   ciprofloxacin -dexamethasone  (CIPRODEX ) OTIC suspension Place 4 drops into the right ear 2 (two) times daily for 7 days. 7.5 mL Aldrich Lloyd, Jodi R, NP      PDMP not reviewed this encounter.   Loreda Myla SAUNDERS, NP 07/24/24 1259
